# Patient Record
Sex: Male | Born: 1935 | Race: White | Hispanic: No | Marital: Married
Health system: Southern US, Community
[De-identification: ages and names within clinical notes are randomized; demographics above are authoritative.]

---

## 2004-10-14 ENCOUNTER — Emergency Department: Payer: Self-pay | Admitting: Emergency Medicine

## 2004-10-14 ENCOUNTER — Other Ambulatory Visit: Payer: Self-pay

## 2004-10-28 ENCOUNTER — Ambulatory Visit: Payer: Self-pay | Admitting: Family Medicine

## 2005-08-31 ENCOUNTER — Ambulatory Visit: Payer: Self-pay | Admitting: Urology

## 2006-04-06 ENCOUNTER — Ambulatory Visit: Payer: Self-pay | Admitting: Psychiatry

## 2006-05-03 ENCOUNTER — Ambulatory Visit: Payer: Self-pay | Admitting: Psychiatry

## 2006-05-11 ENCOUNTER — Ambulatory Visit: Payer: Self-pay | Admitting: Psychiatry

## 2006-08-26 IMAGING — CT CT ABD-PELV W/O CM
1 of 2 series · 15 of 32 positions shown, 19 images · non-contrast
Comparison: none

REASON FOR EXAM: (1) abd pain  STONE EVAL RM 12; (2) abd pain STONE EVAL RM
12
COMMENTS:

[Series 2: stone · axial · 0.72mm/px · z∈[-792,-354]mm · 15 of 160 slices shown, 19 images]
[im 7/160  soft-tissue]
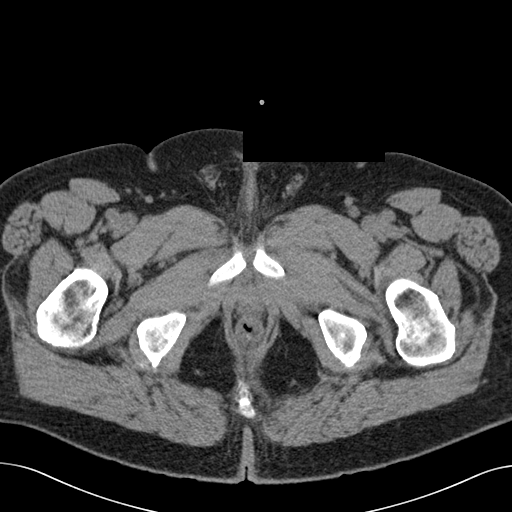
[im 7/160  bone]
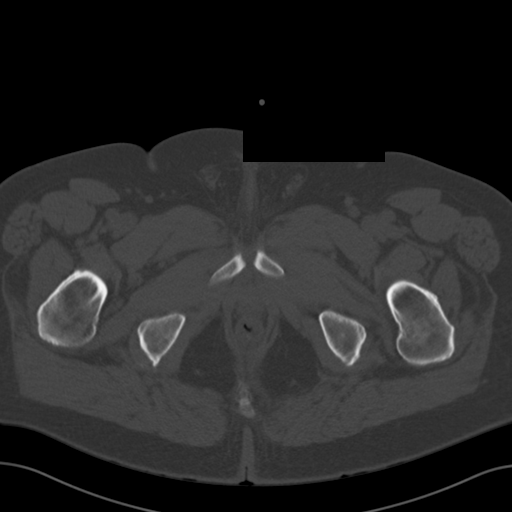
[im 20/160  soft-tissue]
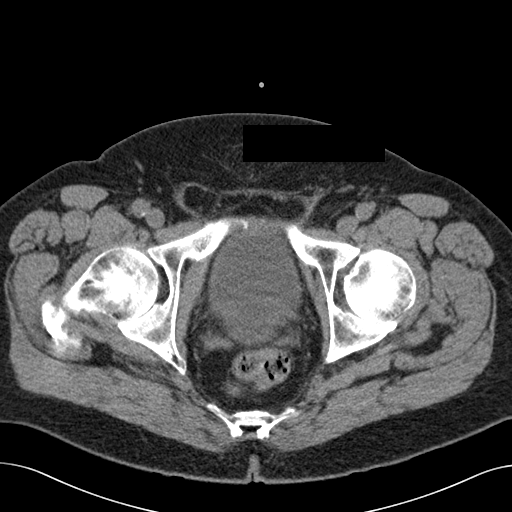
[im 34/160  soft-tissue]
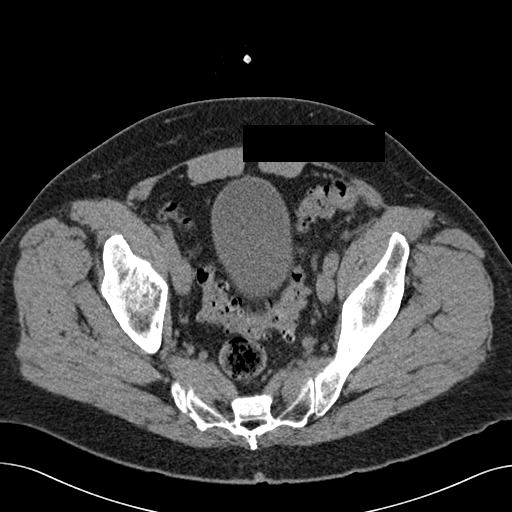
[im 47/160  soft-tissue]
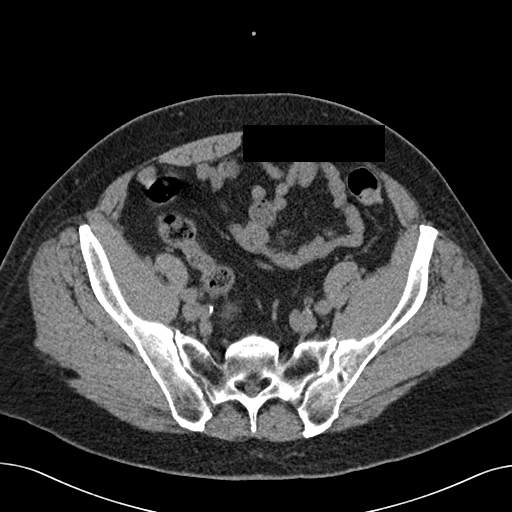
[im 54/160  soft-tissue]
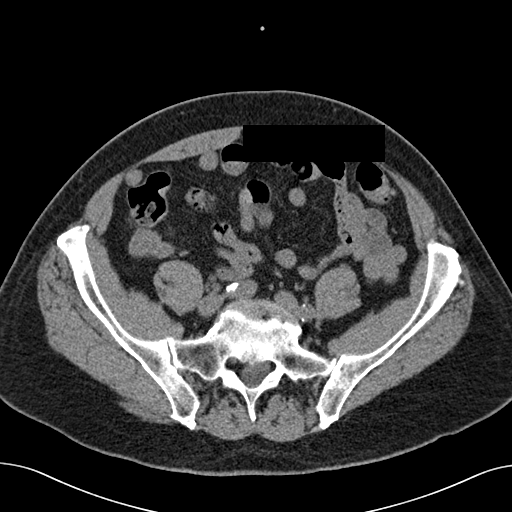
[im 67/160  soft-tissue]
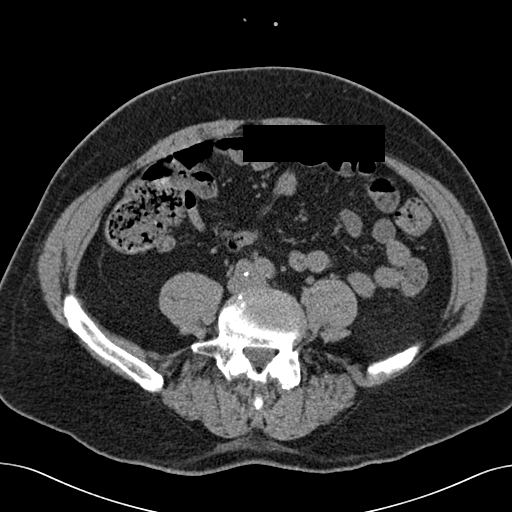
[im 80/160  soft-tissue]
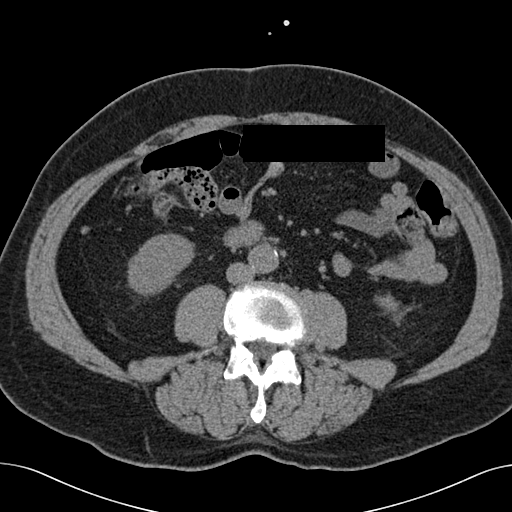
[im 93/160  soft-tissue]
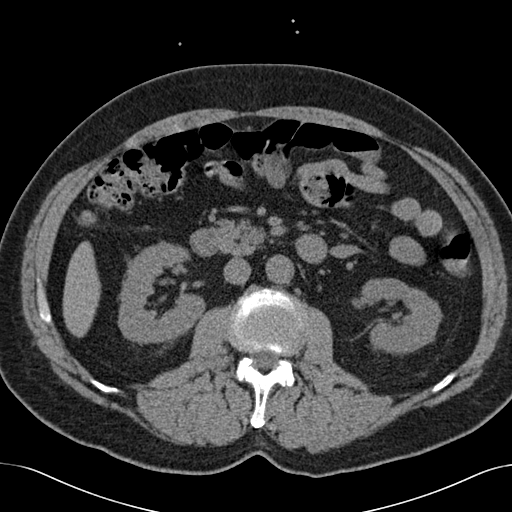
[im 107/160  soft-tissue]
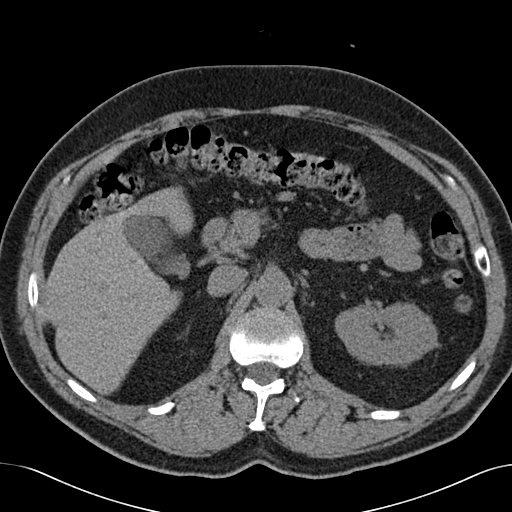
[im 107/160  bone]
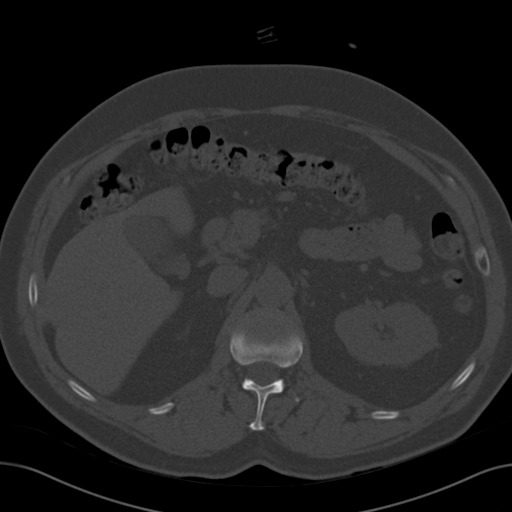
[im 113/160  soft-tissue]
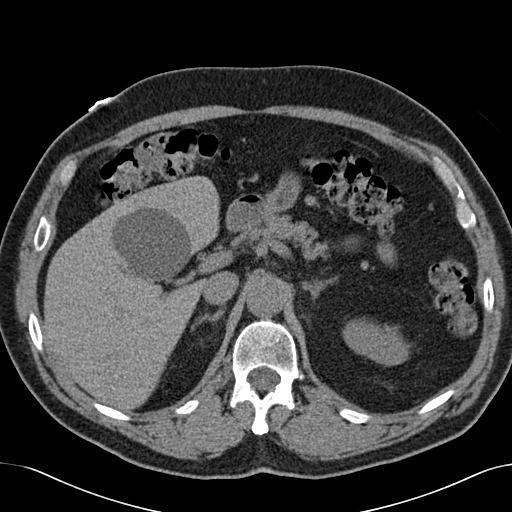
[im 126/160  soft-tissue]
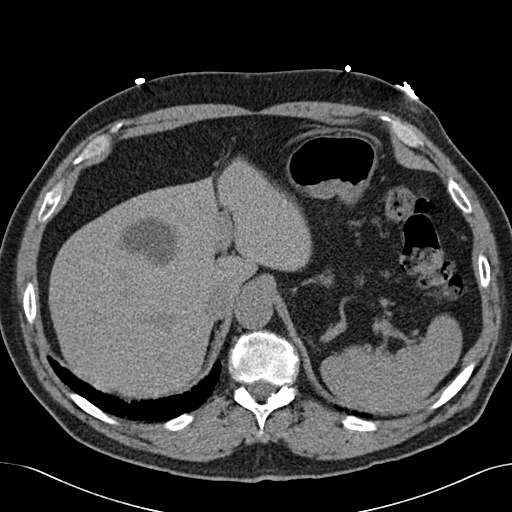
[im 133/160  lung]
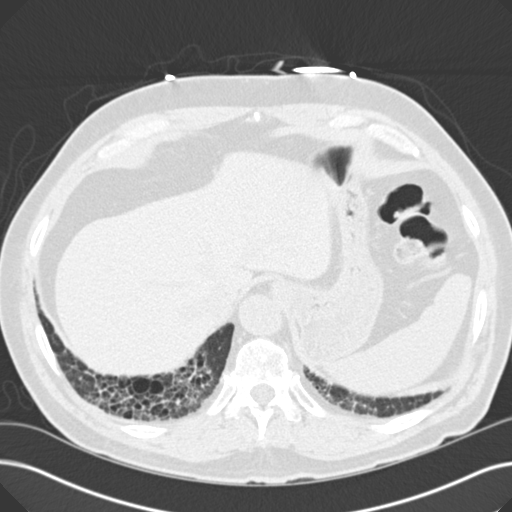
[im 140/160  soft-tissue]
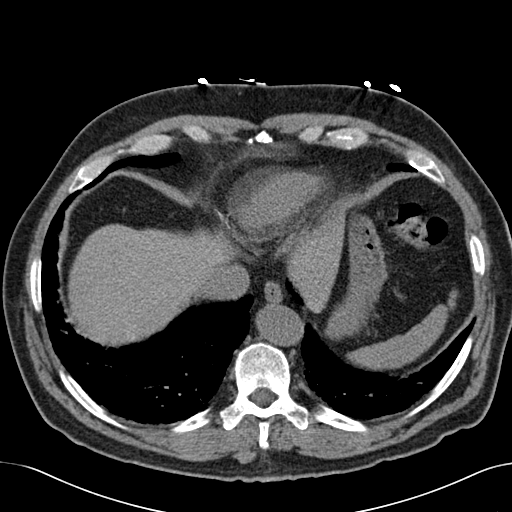
[im 140/160  lung]
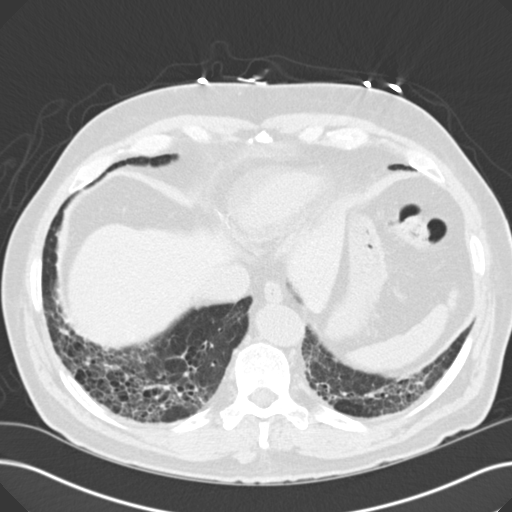
[im 146/160  lung]
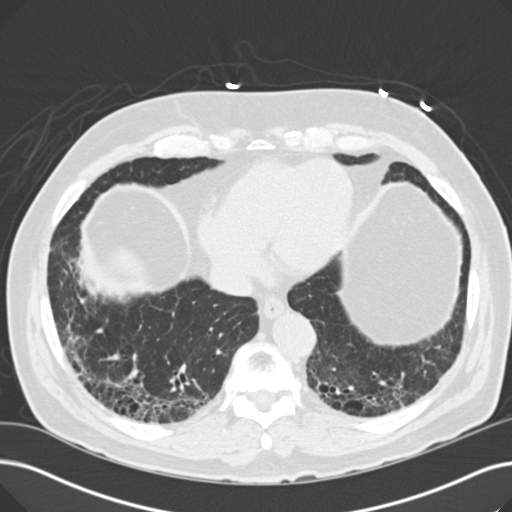
[im 153/160  soft-tissue]
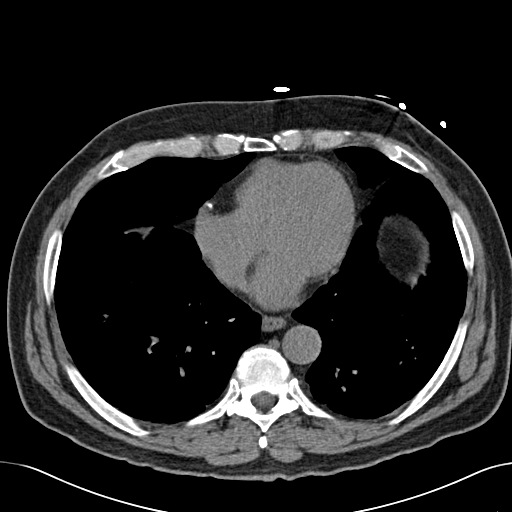
[im 153/160  lung]
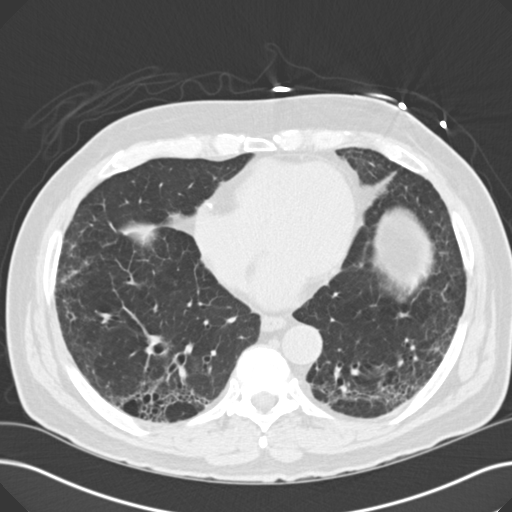

[15 of 32 positions shown; findings below may reference images not displayed]

PROCEDURE:     CT  - CT ABDOMEN AND PELVIS W[DATE]  [DATE]

RESULT:     Unenhanced, emergent abdominal pelvic CT was performed from the
hemidiaphragm to the pubic symphysis.  There is noted a large, low density
in the area of the RIGHT lobe of the liver anteriorly as well as smaller low
density area posteriorly in the RIGHT lobe which can represent cyst.  This
can be further interrogated with ultrasound and also follow up enhanced CT
images.

Pancreas appears intact while the adrenals are intact.

No renal calculi are seen.  No hydronephrosis is noted.  No retroperitoneal
adenopathy is seen.  No fluid is noted in the abdomen and/or pelvis to
suggest an inflammatory process.  Multiple diverticula noted in the sigmoid
colon region.
IMPRESSION: Apparent cyst in the RIGHT lobe of the liver which can be
further investigated with ultrasound contrast CT.  Multiple diverticula
noted in the sigmoid colon region, otherwise, no significant abnormalities
noted in the abdomen and/or pelvis.  On lung window setting there is noted
fibrosis in the lower lobes bilaterally.

## 2007-12-08 ENCOUNTER — Observation Stay: Payer: Self-pay | Admitting: Specialist

## 2007-12-08 ENCOUNTER — Other Ambulatory Visit: Payer: Self-pay

## 2008-03-16 ENCOUNTER — Ambulatory Visit: Payer: Self-pay | Admitting: Physician Assistant

## 2009-05-06 ENCOUNTER — Ambulatory Visit: Payer: Self-pay | Admitting: Neurology

## 2009-05-15 ENCOUNTER — Ambulatory Visit: Payer: Self-pay | Admitting: Neurology

## 2009-11-28 ENCOUNTER — Ambulatory Visit: Payer: Self-pay | Admitting: Specialist

## 2009-12-06 ENCOUNTER — Ambulatory Visit: Payer: Self-pay | Admitting: Specialist

## 2010-01-30 ENCOUNTER — Ambulatory Visit: Payer: Self-pay | Admitting: Internal Medicine

## 2010-02-12 ENCOUNTER — Ambulatory Visit: Payer: Self-pay | Admitting: Specialist

## 2010-02-19 ENCOUNTER — Ambulatory Visit: Payer: Self-pay | Admitting: Internal Medicine

## 2010-04-01 ENCOUNTER — Ambulatory Visit: Payer: Self-pay | Admitting: Specialist

## 2010-04-02 ENCOUNTER — Emergency Department: Payer: Self-pay | Admitting: Emergency Medicine

## 2010-09-16 ENCOUNTER — Ambulatory Visit: Payer: Self-pay | Admitting: Specialist

## 2011-02-11 ENCOUNTER — Ambulatory Visit: Payer: Self-pay | Admitting: Specialist

## 2011-08-13 ENCOUNTER — Ambulatory Visit: Payer: Self-pay | Admitting: Specialist

## 2011-08-21 ENCOUNTER — Ambulatory Visit: Payer: Self-pay | Admitting: Specialist

## 2011-08-21 LAB — CREATININE, SERUM
EGFR (African American): >60
EGFR (Non-African Amer.): >60

## 2011-10-14 ENCOUNTER — Ambulatory Visit: Payer: Self-pay | Admitting: Ophthalmology

## 2011-10-19 ENCOUNTER — Ambulatory Visit: Payer: Self-pay | Admitting: Ophthalmology

## 2011-11-09 ENCOUNTER — Ambulatory Visit: Payer: Self-pay | Admitting: Ophthalmology

## 2012-08-31 ENCOUNTER — Ambulatory Visit: Payer: Self-pay | Admitting: Specialist

## 2012-11-04 ENCOUNTER — Inpatient Hospital Stay: Payer: Self-pay | Admitting: Family Medicine

## 2012-11-04 LAB — COMPREHENSIVE METABOLIC PANEL
Albumin: 3.2 g/dL — ABNORMAL LOW (ref 3.4–5.0)
Alkaline Phosphatase: 40 U/L — ABNORMAL LOW (ref 50–136)
Bilirubin,Total: 0.5 mg/dL (ref 0.2–1.0)
Calcium, Total: 8.3 mg/dL — ABNORMAL LOW (ref 8.5–10.1)
Chloride: 102 mmol/L (ref 98–107)
Co2: 31 mmol/L (ref 21–32)
EGFR (African American): >60
Glucose: 98 mg/dL (ref 65–99)
Potassium: 3.6 mmol/L (ref 3.5–5.1)
SGOT(AST): 22 U/L (ref 15–37)
SGPT (ALT): 20 U/L (ref 12–78)
Total Protein: 6.7 g/dL (ref 6.4–8.2)

## 2012-11-04 LAB — CBC
HGB: 14 g/dL (ref 13.0–18.0)
MCHC: 34.1 g/dL (ref 32.0–36.0)
MCV: 95 fL (ref 80–100)
RDW: 13.4 % (ref 11.5–14.5)

## 2012-11-04 LAB — CK TOTAL AND CKMB (NOT AT ARMC): CK-MB: <0.5 ng/mL — ABNORMAL LOW (ref 0.5–3.6)

## 2012-11-04 LAB — TROPONIN I: Troponin-I: <0.02 ng/mL

## 2012-11-06 LAB — CBC WITH DIFFERENTIAL/PLATELET
HGB: 14 g/dL (ref 13.0–18.0)
Lymphocyte %: 8.1 %
MCH: 32.4 pg (ref 26.0–34.0)
MCHC: 33.9 g/dL (ref 32.0–36.0)
MCV: 95 fL (ref 80–100)
Monocyte #: 0.4 x10 3/mm (ref 0.2–1.0)
Monocyte %: 4.4 %
Neutrophil #: 8.8 10*3/uL — ABNORMAL HIGH (ref 1.4–6.5)
Neutrophil %: 87.5 %
Platelet: 175 10*3/uL (ref 150–440)
RDW: 13.5 % (ref 11.5–14.5)
WBC: 10.1 10*3/uL (ref 3.8–10.6)

## 2012-11-06 LAB — BASIC METABOLIC PANEL
Anion Gap: 3 — ABNORMAL LOW (ref 7–16)
Calcium, Total: 8.6 mg/dL (ref 8.5–10.1)
Chloride: 103 mmol/L (ref 98–107)
Co2: 32 mmol/L (ref 21–32)
Creatinine: 1.02 mg/dL (ref 0.60–1.30)
EGFR (African American): >60
EGFR (Non-African Amer.): >60
Glucose: 127 mg/dL — ABNORMAL HIGH (ref 65–99)

## 2012-11-07 LAB — EXPECTORATED SPUTUM ASSESSMENT W GRAM STAIN, RFLX TO RESP C

## 2012-11-07 LAB — CBC WITH DIFFERENTIAL/PLATELET
Basophil #: 0 10*3/uL (ref 0.0–0.1)
Eosinophil %: 0.3 %
HCT: 40.5 % (ref 40.0–52.0)
HGB: 13.7 g/dL (ref 13.0–18.0)
Lymphocyte #: 1.5 10*3/uL (ref 1.0–3.6)
MCH: 32.3 pg (ref 26.0–34.0)
MCHC: 33.8 g/dL (ref 32.0–36.0)
Neutrophil %: 73.6 %
Platelet: 162 10*3/uL (ref 150–440)
RDW: 13.3 % (ref 11.5–14.5)
WBC: 9.2 10*3/uL (ref 3.8–10.6)

## 2013-06-24 IMAGING — RF DG CHEST FLUORO
2 series · 8 of 8 positions shown · non-contrast
Comparison: none

REASON FOR EXAM: SNIFF TEST elevated right diaphragm
COMMENTS:

PROCEDURE:     FL  - FL CHEST FLUORO W/ SPOT FILMS  - August 13, 2011  [DATE]
RESULT:     Comparison: Chest radiograph from [REDACTED] 08/11/2011
TECHNIQUE: The diaphragms were monitored with intermittent fluoroscopy
during inspiration and expiration.

[Series 2: fluoro_chest_bw · 0.18mm/px · 4 of 20 frames shown (1 of 2)]
[frame 4/20]
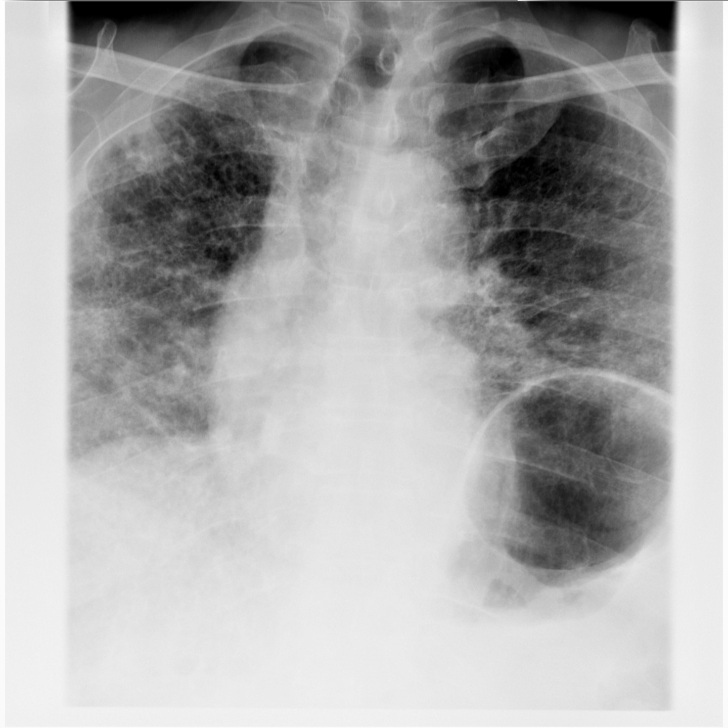
[frame 11/20]
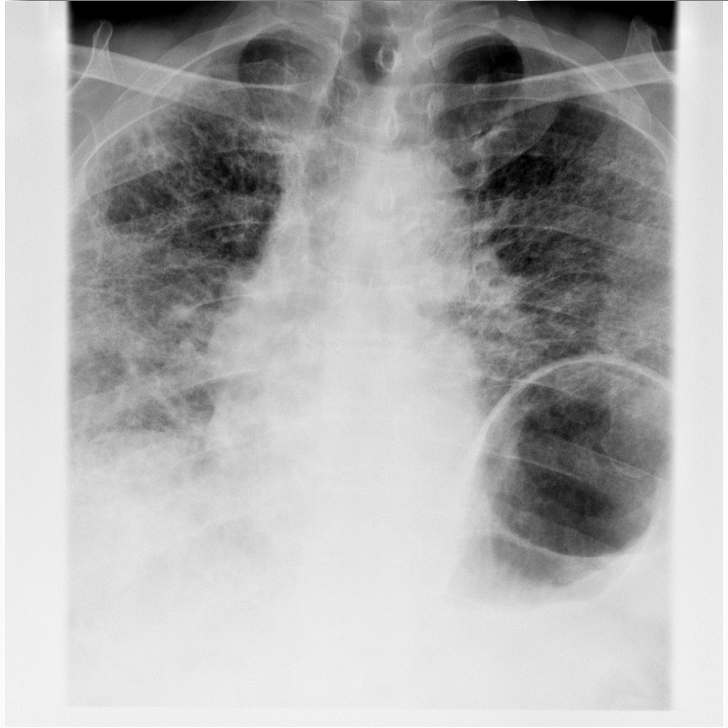
[frame 13/20]
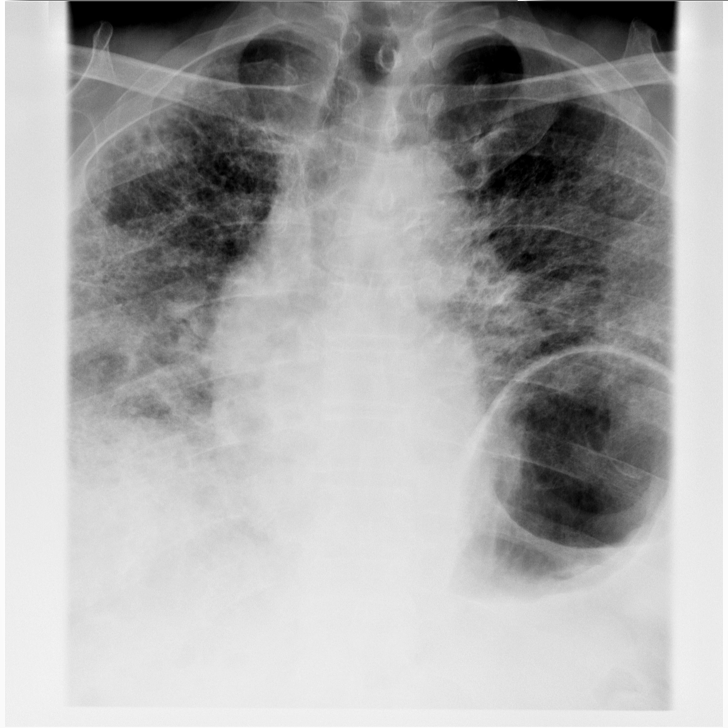
[frame 18/20]
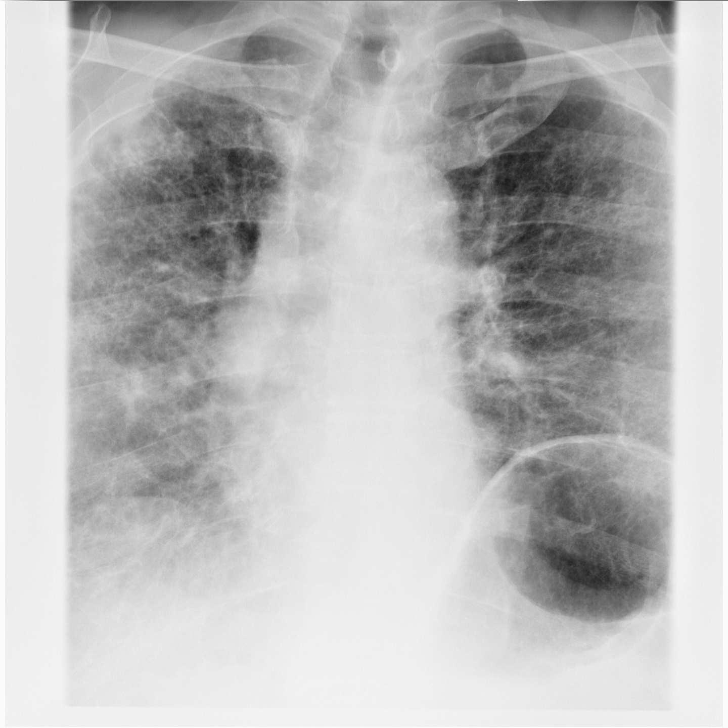

[Series 3: fluoro_chest_bw · 0.18mm/px · 4 of 14 frames shown (2 of 2)]
[frame 1/14]
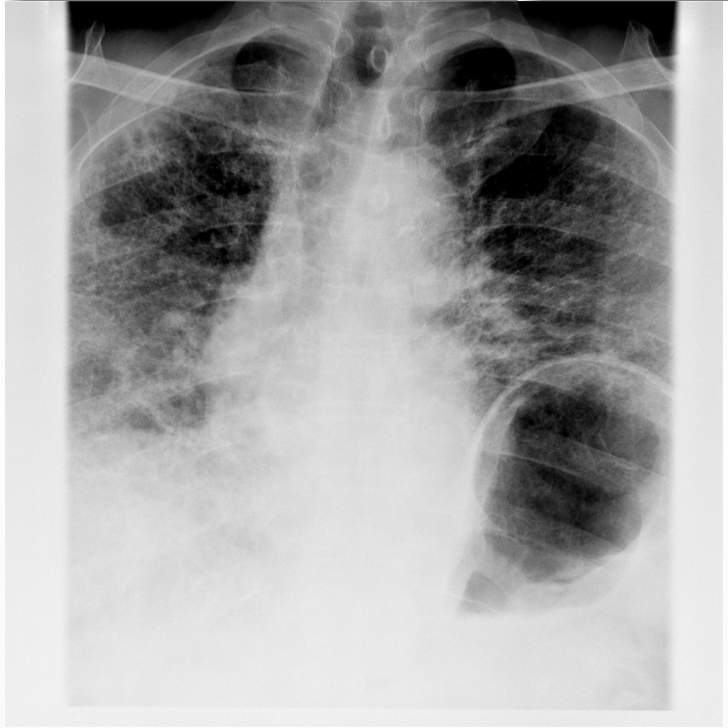
[frame 3/14]
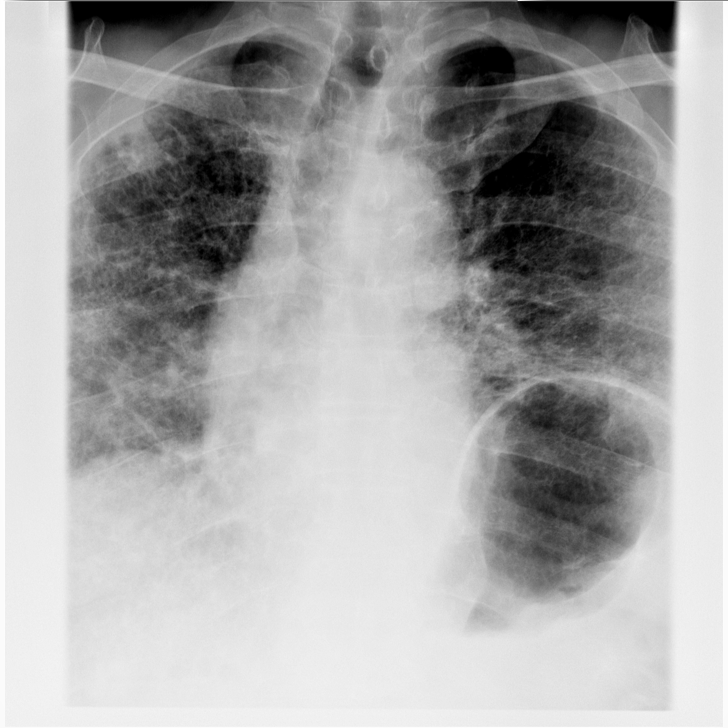
[frame 8/14]
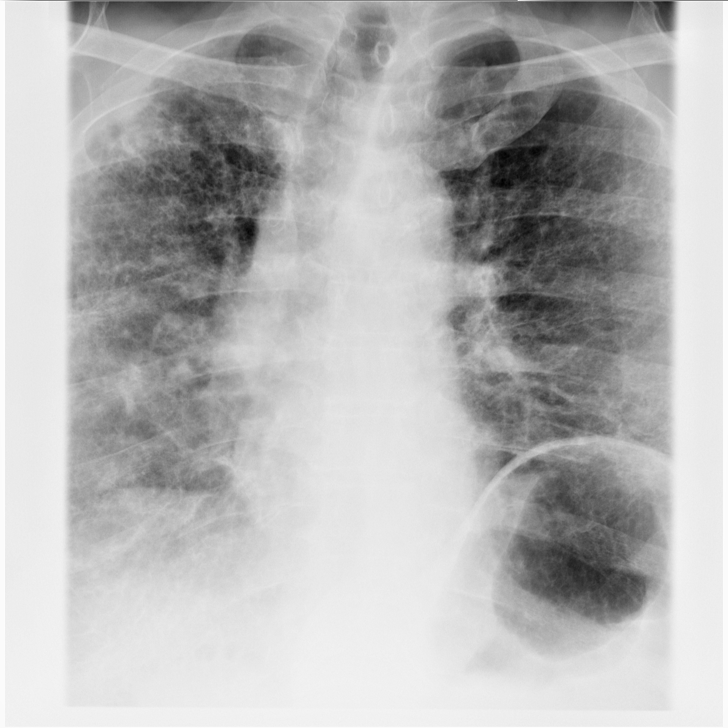
[frame 12/14]
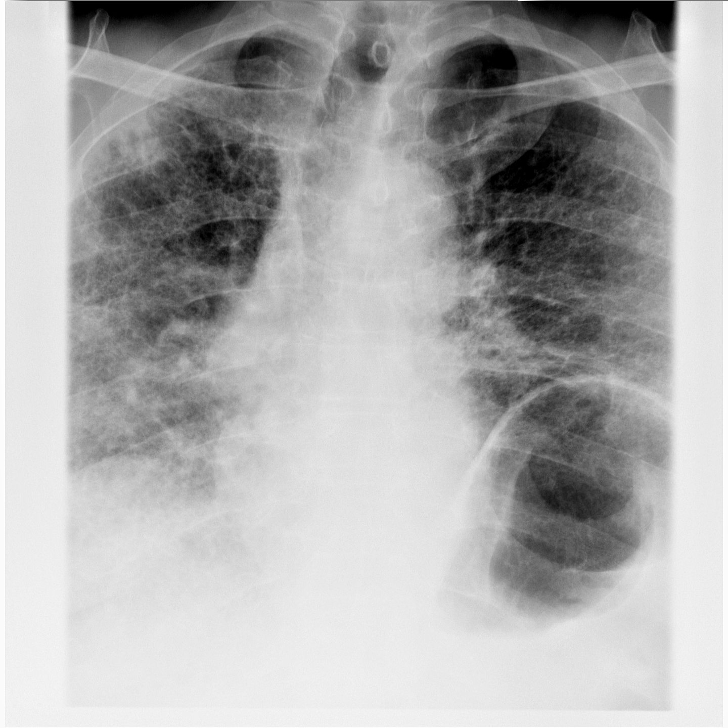

[8 of 8 positions shown; findings below may reference images not displayed]

FINDINGS: There is mild elevation of the left hemidiaphragm. There is excursion of the
diaphragm bilaterally, with inspiration and expiration. With the sniff test,
there is appropriate excursion of the diaphragm bilaterally.

There are coarse interstitial opacities in the lungs, consistent with
fibrosis. There is more focal opacity at the periphery the right upper lobe,
as seen on recent prior chest radiograph.
IMPRESSION: 1. No evidence of diaphragm paralysis.
2. Findings of pulmonary fibrosis, with more focal opacity at the periphery
right upper lobe. Please see recent prior chest radiograph report where
further evaluation with chest CT was recommended.

## 2013-11-12 ENCOUNTER — Emergency Department: Payer: Self-pay | Admitting: Emergency Medicine

## 2013-11-12 LAB — CBC
HCT: 45 % (ref 40.0–52.0)
HGB: 14.8 g/dL (ref 13.0–18.0)
MCH: 32 pg (ref 26.0–34.0)
MCHC: 32.9 g/dL (ref 32.0–36.0)
MCV: 97 fL (ref 80–100)
Platelet: 225 10*3/uL (ref 150–440)
RBC: 4.62 10*6/uL (ref 4.40–5.90)
RDW: 12.5 % (ref 11.5–14.5)
WBC: 11.1 10*3/uL — ABNORMAL HIGH (ref 3.8–10.6)

## 2013-11-12 LAB — URINALYSIS, COMPLETE
Bilirubin,UR: NEGATIVE
Glucose,UR: NEGATIVE mg/dL (ref 0–75)
Ketone: NEGATIVE
Leukocyte Esterase: NEGATIVE
Nitrite: NEGATIVE
Ph: 5 (ref 4.5–8.0)
Protein: 100
RBC,UR: 2667 /HPF (ref 0–5)
Specific Gravity: 1.023 (ref 1.003–1.030)
Squamous Epithelial: NONE SEEN
WBC UR: 6 /HPF (ref 0–5)

## 2013-11-12 LAB — COMPREHENSIVE METABOLIC PANEL
ALBUMIN: 3.6 g/dL (ref 3.4–5.0)
ALK PHOS: 55 U/L
ANION GAP: 6 — AB (ref 7–16)
BILIRUBIN TOTAL: 0.5 mg/dL (ref 0.2–1.0)
BUN: 19 mg/dL — ABNORMAL HIGH (ref 7–18)
CALCIUM: 9 mg/dL (ref 8.5–10.1)
CREATININE: 1.22 mg/dL (ref 0.60–1.30)
Chloride: 103 mmol/L (ref 98–107)
Co2: 27 mmol/L (ref 21–32)
EGFR (African American): >60
GFR CALC NON AF AMER: 54 — AB
GLUCOSE: 133 mg/dL — AB (ref 65–99)
OSMOLALITY: 276 (ref 275–301)
Potassium: 4.3 mmol/L (ref 3.5–5.1)
SGOT(AST): 23 U/L (ref 15–37)
SGPT (ALT): 22 U/L (ref 12–78)
SODIUM: 136 mmol/L (ref 136–145)
TOTAL PROTEIN: 7.5 g/dL (ref 6.4–8.2)

## 2013-11-12 LAB — LIPASE, BLOOD: LIPASE: 162 U/L (ref 73–393)

## 2013-12-08 ENCOUNTER — Inpatient Hospital Stay: Payer: Self-pay | Admitting: Internal Medicine

## 2013-12-08 ENCOUNTER — Ambulatory Visit: Payer: Self-pay | Admitting: Urology

## 2013-12-08 LAB — CBC
HCT: 43.7 %
HGB: 14 g/dL
MCH: 31.2 pg
MCHC: 32.1 g/dL
MCV: 97 fL
Platelet: 215 x10 3/mm 3
RBC: 4.5 x10 6/mm 3
RDW: 12.7 %
WBC: 11.2 x10 3/mm 3 — ABNORMAL HIGH

## 2013-12-08 LAB — COMPREHENSIVE METABOLIC PANEL WITH GFR
Albumin: 3.6 g/dL
Alkaline Phosphatase: 47 U/L
Anion Gap: 8
BUN: 17 mg/dL
Bilirubin,Total: 0.6 mg/dL
Calcium, Total: 8.7 mg/dL
Chloride: 102 mmol/L
Co2: 28 mmol/L
Creatinine: 1.25 mg/dL
EGFR (African American): >60
EGFR (Non-African Amer.): 53 — ABNORMAL LOW
Glucose: 134 mg/dL — ABNORMAL HIGH
Osmolality: 279
Potassium: 4.1 mmol/L
SGOT(AST): 19 U/L
SGPT (ALT): 20 U/L
Sodium: 138 mmol/L
Total Protein: 7.3 g/dL

## 2013-12-08 LAB — URINALYSIS, COMPLETE
Bilirubin,UR: NEGATIVE
Glucose,UR: NEGATIVE mg/dL (ref 0–75)
Ketone: NEGATIVE
Leukocyte Esterase: NEGATIVE
Nitrite: NEGATIVE
Ph: 7 (ref 4.5–8.0)
Protein: 100
RBC,UR: 1557 /HPF (ref 0–5)
Specific Gravity: 1.016 (ref 1.003–1.030)
Squamous Epithelial: NONE SEEN

## 2013-12-10 LAB — URINE CULTURE

## 2013-12-27 ENCOUNTER — Ambulatory Visit: Payer: Self-pay | Admitting: Urology

## 2013-12-28 ENCOUNTER — Ambulatory Visit: Payer: Self-pay | Admitting: Urology

## 2014-01-01 ENCOUNTER — Ambulatory Visit: Payer: Self-pay | Admitting: Urology

## 2014-01-16 ENCOUNTER — Ambulatory Visit: Payer: Self-pay | Admitting: Urology

## 2014-01-16 LAB — BASIC METABOLIC PANEL
Anion Gap: 6 — ABNORMAL LOW (ref 7–16)
BUN: 10 mg/dL (ref 7–18)
CALCIUM: 8.5 mg/dL (ref 8.5–10.1)
CO2: 32 mmol/L (ref 21–32)
Chloride: 103 mmol/L (ref 98–107)
Creatinine: 0.95 mg/dL (ref 0.60–1.30)
EGFR (African American): >60
EGFR (Non-African Amer.): >60
Glucose: 101 mg/dL — ABNORMAL HIGH (ref 65–99)
OSMOLALITY: 280 (ref 275–301)
POTASSIUM: 4.4 mmol/L (ref 3.5–5.1)
SODIUM: 141 mmol/L (ref 136–145)

## 2014-01-22 ENCOUNTER — Ambulatory Visit: Payer: Self-pay | Admitting: Urology

## 2014-07-24 ENCOUNTER — Emergency Department: Payer: Self-pay | Admitting: Emergency Medicine

## 2014-09-21 NOTE — Discharge Summary (Signed)
PATIENT NAME:  Kevin BookbinderFAUCETTE, Juvencio L MR#:  [id number] DATE OF BIRTH:  June 27, 1926  DATE OF ADMISSION:  11/04/2012 DATE OF DISCHARGE:    DISCHARGE DIAGNOSES:  1.  Acute on chronic respiratory failure secondary to chronic obstructive pulmonary disease exacerbation and underlying pulmonary fibrosis.  2.  History of seizure disorder.   DISCHARGE MEDICATIONS:  1.  Keppra 750 mg 1 tab p.o. b.i.d.  2.  Prednisone 40 mg taper and will continue to 20 mg indefinitely will follow up with Dr. Meredeth IdeFleming for further instruction.  3.  Levaquin 500 mg p.o. daily x 4 more days.  4.  Azathioprine 50 mg p.o. daily.  5.  Pro-Air 90 mcg 2 puffs every 6 hours as needed for wheezing.   CONSULTS: None.   PROCEDURES: None.   PERTINENT LABS AND STUDIES: Noncontributory.   BRIEF HOSPITAL COURSE: The patient initially came in complaining of acute on chronic respiratory failure with underlying COPD and pulmonary fibrosis. His O2 sats were in the 70s upon admission. He was placed on IV Solu-Medrol and Levaquin. He was transitioned over to oral prednisone and his O2 sats did improve above 92% on 4 L, which is baseline for him. He denied any further cough and no further chest discomfort. Shortness of breath was at baseline upon discharge. He will continue his home regimen and continue the prednisone taper. Follow up with Dr. Meredeth IdeFleming in 1 week. Other medical issues of seizure disorder remained stable. Continue on the Keppra.   DISPOSITION: He is in stable condition and will be discharged to home.   FOLLOWUP: Follow up with Dr. Meredeth IdeFleming in 1 week.  ____________________________ Marisue IvanKanhka Jesselle Laflamme, MD kl:aw D: 11/07/2012 08:01:28 ET T: 11/07/2012 08:37:55 ET JOB#: [id number]  cc: Marisue IvanKanhka Dilcia Rybarczyk, MD, <Dictator> Marisue IvanKANHKA Shenell Rogalski MD ELECTRONICALLY SIGNED 11/17/2012 7:58

## 2014-09-21 NOTE — H&P (Signed)
PATIENT NAME:  Kevin Rasmussen, Ford L MR#:  [id number] DATE OF BIRTH:  Mar 10, 1927  DATE OF ADMISSION:  11/04/2012  PRIMARY CARE PHYSICIAN: Daniel NonesBert Klein, MD  PULMONOLOGIST:  Ned ClinesHerbon Fleming, MD  CHIEF COMPLAINT: My oxygen sats dropped down into the 70s, I could not breathe.  HISTORY OF PRESENT ILLNESS:  Mr. Kevin Rasmussen is a pleasant 81 year old Caucasian gentleman with history of severe pulmonary fibrosis, on home oxygen, about 3 liters per minute, and history of osteoarthritis along with seizure disorder who comes to the Emergency Room accompanied by his wife and daughter with increasing shortness of breath, difficulty with maintaining sats despite taking his steroids and breathing treatments at home. His sats dropped down into the 70s. He felt "I was going to die."   Came to the Emergency Room. Received a high dose of IV steroids, breathing treatment, now feels much better and was having lunch during my evaluation.  PAST MEDICAL HISTORY:  1.  Osteoarthritis with history of carpal tunnel and cervical and lumbar disk disease.  2.  Chronic respiratory failure secondary to severe bilateral pulmonary fibrosis, now on prednisone and recently started on azathioprine by Dr. Meredeth IdeFleming.  3.  Chronic home oxygen.  4.  Acid reflux.  5.  Ex-smoker.  6.  Obstructive sleep apnea.  7.  Seizure disorder, followed by Dr. Sherryll BurgerShah.  PAST SURGICAL HISTORY:  1.  Left inguinal hernia repair.  2.  Left elbow fracture with no dislocation.  3.  Left carpal tunnel release.  ALLERGIES: ERYTHROMYCIN AND MYSOLINE.  MEDICATIONS: 1.  Azathioprine 50 mg daily.  2.  Mucinex DM 1 tablet b.i.d.  3.  Prednisone 20 mg daily.  4.  Keppra 750 mg b.i.d.    FAMILY HISTORY: Noted with multiple family members with pulmonary fibrosis.  SOCIAL HISTORY: Married. Lives with wife at home. Uses oxygen at home.   REVIEW OF SYSTEMS:    CONSTITUTIONAL: Positive for fatigue, weakness.  EYES: No blurred or double vision or glaucoma.  ENT: No  tinnitus, ear pain, hearing loss.  RESPIRATORY: Positive for shortness of breath, wheezing, and COPD.  CARDIOVASCULAR: No chest pain, orthopnea, edema. Positive for dyspnea on exertion.  GASTROINTESTINAL: No nausea, vomiting, diarrhea or abdominal pain.  GENITOURINARY: No dysuria or hematuria.  ENDOCRINE: No polyuria, nocturia or thyroid problems.  HEMATOLOGY: No anemia or easy bruising.  SKIN: No acne or rash. GENITOURINARY: No dysuria or hematuria.  ENDOCRINE: No polyuria, nocturia or thyroid problems.  MUSCULOSKELETAL: Positive for arthritis and back pain.  NEUROLOGIC: No CVA, TIA, vertigo or tremor.  PSYCHIATRIC: No anxiety or depression. All other systems reviewed and negative.   PHYSICAL EXAMINATION: GENERAL: The patient is awake, alert and oriented x 3, not in acute distress.  VITAL SIGNS: Afebrile, pulse is 82, blood pressure is 140/49, sats are 91 to 94% on 3.5 liters nasal cannula.  HEENT: Atraumatic, normocephalic. Pupils are equal, round and reactive to light and accommodation. EOM intact. Oral mucosa is moist.  NECK: Supple. No JVD. No carotid bruit. LUNGS:  There are bilateral coarse crackles present in both upper and lower lobes. Distant breath sounds. No use of accessory muscles.  HEART:  Both the heart sounds are normal. Rate and rhythm regular. PMI not lateralized. Chest is nontender.  EXTREMITIES: Good pedal pulses. Good femoral pulses. Trace lower extremity edema.  ABDOMEN: Soft, benign and nontender. No organomegaly. Positive bowel sounds.  NEUROLOGIC: Grossly intact cranial nerves II through XII. No motor or sensory deficit.  PSYCHIATRIC: The patient is awake, alert and oriented x  3.   LABORATORY AND DIAGNOSTICS: CBC within normal limits.   Chest x-ray consistent with increased interstitial markings diffusely, acute interstitial edema of cardiac cause superimposed upon chronic bullous emphysema. EKG shows sinus rhythm.   CBC within normal limits. Comprehensive  metabolic panel within normal limits. Troponin is less than 0.02.   ASSESSMENT: 81 year old Kevin Rasmussen with history of bilateral pulmonary fibrosis, chronic respiratory failure and osteoarthritis who comes in with increasing shortness of breath since today.  Recently his wife started getting "cold" symptoms  and the patient started coughing up some yellow phlegm, came in with sats in the 70s. He received IV Solu-Medrol in the Emergency Room. He is being admitted for:  1.  Acute on chronic hypoxic respiratory failure secondary to chronic obstructive pulmonary disease exacerbation in the setting of severe bilateral pulmonary fibrosis. The patient will be admitted on the medical floor. Will give regular diet. IV Solu-Medrol around-the-clock along with p.o. Levaquin. We will consider continuing albuterol/ipratropium inhalers around-the-clock. Check sputum culture. If the patient's symptoms do not improve, consider pulmonary consultation. 2.  Severe bilateral pulmonary fibrosis.  Follows with Dr. Meredeth Ide as outpatient.  Recently the patient was started on azathioprine, which we will continue. The patient also takes 20 mg prednisone, which will be started once the prednisone taper has been started. Will continue nebulizer treatments around-the-clock along with oxygen.  3.  Seizure disorder. Resume Keppra 750 b.i.d.  4.  Deep venous thrombosis prophylaxis with sub-Q heparin.   Further work-up according to the patient's clinical course. Hospital admission plan was discussed with the patient and the patient's family members.   TIME SPENT: 50 minutes. ____________________________ Wylie Hail Allena Katz, MD sap:sb D: 11/04/2012 14:13:50 ET T: 11/04/2012 15:34:11 ET JOB#: [id number]  cc: Johnpatrick Jenny A. Allena Katz, MD, <Dictator> Curtis Sites III, MD Herbon E. Meredeth Ide, MD  Willow Ora MD ELECTRONICALLY SIGNED 11/12/2012 14:34

## 2014-09-22 NOTE — Op Note (Signed)
PATIENT NAME:  Kevin Rasmussen, Kevin Rasmussen MR#:  [id number] DATE OF BIRTH:  04-13-1927  DATE OF PROCEDURE:  12/09/2013  PREOPERATIVE DIAGNOSES:  1.  Left hydronephrosis.  2.  Left ureterolithiasis.   POSTOPERATIVE DIAGNOSES:  1.  Left hydronephrosis.  2.  Left ureterolithiasis.   PROCEDURE PERFORMED: 1.  Left 6 French x 26 cm Contour ureteral stent placement. 2.  Left diagnostic ureteroscopy with attempted basket extraction of stone.  3.  Left retrograde pyelogram. 4.  Interpretation of urography.  5.  Intraoperative fluoroscopy, total time less than 1 hour.   ANESTHESIA:  General anesthesia with LMA.   COMPLICATIONS: None.   SPECIMENS: None.   DRAINS: Ureteral stent as above.   FINDINGS: There was about a 6 mm stone present in the distal ureter, but it would not pull through with the basket. I did not have access to a laser and, thus, I did not feel it was prudent to try to pull the stone down through this area and risk damage to the ureter.   INDICATIONS FOR SURGERY: Mr. Kevin Rasmussen is a pleasant 81 year old male with a 4-week history of left renal colic. He presented to the Emergency Room for the second time yesterday. After discussing risks and benefits of the operation, including potential ureteroscopic stone extraction, the patient elected to proceed to the OR for, at the minimum, stent placement and possible stent removal.   OPERATIVE PROCEDURE:  After the patient correctly identified in the preoperative room, he was brought to the operating room and placed supine on the operating table.  A pre-procedure timeout was called. Preoperative antibiotics were administered and general anesthesia with LMA was then induced. The patient then placed in the dorsal lithotomy position taking care to pad all pressure points. He was then sterilely prepped and draped in the usual fashion followed by a pre-incision timeout. Ample lubrication with saline running, a 21 French rigid cystoscope was advanced per  urethra in the bladder. There were no urethral or prostatic abnormalities. There were no stones present in the bladder. No bladder masses.  The left ureteral orifice was cannulated with a guidewire and then an open-ended catheter was passed over this.  A retrograde pyelogram was shot and the stone was noted in the distal ureter. There was mild to moderate left hydroureter and hydronephrosis. This wire was then advanced up into the kidney through the  open-ended catheter. A second wire was then placed in the kidney. The flexible ureteroscope was then advanced over the access wire leaving the safety wire in place. I could not get the flexible ureteroscope through the ureteral orifice, even after removing the axis guidewire. I then switched for a rigid ureteroscope, and I was able to get this through the ureteral orifice up to the stone in the distal ureter.  It was about 6 mm in size. I then passed a 1.9, French skate basket into the ureter and was able to grasp it in the ideal orientation.  I then carefully attempted to pull it down the distal ureter, and while I was able to move it a few centimeters, it would not go through the distal 5 cm or so of ureter. Tried various orientations to do so, but I could not pull it through this narrow area, and given that I did not have access to a laser, I did not think it was prudent to attempt aggressive removal of this stone. I, therefore, left the stone in place and removed the basket and ureteroscope. I shot another retrograde  pyelogram and noted no extravasation of contrast and continued hydronephrosis. Then passed a 6 French x 26 cm Contour ureteral stent over a wire up into the kidney. Good curl was noted in the renal pelvis on fluoroscopy and under direct visualization in the bladder. Images documenting this were taken and saved. Bladder was then drained and the patient was awoken from anesthesia without complication.  Transferred to the recovery room for postoperative  care.    ____________________________ Lisabeth Pick, MD jps:ts D: 12/09/2013 10:19:58 ET T: 12/09/2013 12:04:14 ET JOB#: [id number]  cc: Lisabeth Pick, MD, <Dictator> Aloha Gell [facility] MD ELECTRONICALLY SIGNED 01/10/2014 13:08

## 2014-09-22 NOTE — Discharge Summary (Signed)
PATIENT NAME:  Kevin Rasmussen, Kevin Rasmussen MR#:  [id number] DATE OF BIRTH:  Jan 04, 1927  DATE OF ADMISSION:  12/08/2013 DATE OF DISCHARGE:  12/09/2013  DISCHARGE DIAGNOSES:  1. Ureterolithiasis. 2. Hydronephrosis.  3. Chronic obstructive pulmonary disease on home oxygen.   DISCHARGE MEDICATIONS: Norco p.r.n. pain, azathioprine 50 mg daily, albuterol p.r.n., usual home oxygen.   HISTORY AND PHYSICAL: Please see detailed history and physical done on admission.   HOSPITAL COURSE: The patient was admitted with renal colic, was seen the following day by urology. Removal of the right obstructing stone with hydronephrosis was attempted. This could not be done but a stent was placed which, relieved the hydronephrosis. It was recommended by urology to discharge the patient home on pain control. They also recommended Flomax but patient said he could not tolerate that. He had severe diarrhea episode from that before and did not wish to take that. He had been started on antibiotics so his urine was normal except for hematuria and urine culture was negative to date and he is not felt to need that by urology consult. Therefore, he will not be discharged on that. He already has followup and he will continue to keep his followup with urology as an outpatient. He will call his primary if he has any trouble getting to see them in the next few days.    ____________________________ Marya AmslerMarshall W. Dareen PianoAnderson, MD mwa:lt D: 12/09/2013 12:48:38 ET T: 12/09/2013 21:22:00 ET JOB#: [id number]  cc: Marya AmslerMarshall W. Dareen PianoAnderson, MD, <Dictator> Lauro RegulusMARSHALL W Husein Guedes MD ELECTRONICALLY SIGNED 12/10/2013 11:06

## 2014-09-22 NOTE — H&P (Signed)
PATIENT NAME:  Kevin Rasmussen, Kevin Rasmussen MR#:  [id number] DATE OF BIRTH:  Jul 27, 1926  DATE OF ADMISSION:  12/08/2013  PRIMARY CARE PHYSICIAN:  Dr. Daniel Nones.   REFERRING PHYSICIAN:  Dr. Carollee Massed.    CHIEF COMPLAINT:  Left-sided back and testicular pain.   HISTORY OF PRESENT ILLNESS:  Kevin Rasmussen is an 81 year old male who has been experiencing left flank pain for about three weeks back at which time the patient came to the Emergency Department.  CT abdomen and pelvis revealed the patient had left ureteral stone.  The patient was told that the patient will pass the stone.  If the patient continues to have the pain, recommended the patient to go to the urology, however the patient's symptoms were resolved after two days; however, the patient has been having on and off bloody urine.  Since this morning, started to experience left flank pain radiating into the left testicle.  Concerning this, came to the Emergency Department.  Work-up in the Emergency Department shows increased hydronephrosis and left hydroureter since the previous exam, determining 6 mm distal left ureteral calculus.  The stone has progressed distally from the proximal ureter.  Mildly increased left perinephric edema.  Concerning this, urology was consulted who recommended the patient to be admitted under the medicine service and who will see the patient in the morning to place a stent.  Denies having any fever.  UA, no obvious signs of any infection is noted.  Was nauseated.  No episodes of vomiting.  PAST MEDICAL HISTORY: 1.  COPD.  2.  Pulmonary fibrosis.  3.  History of seizures.  4.  Enlarged prostate.  5.  History of lung mass.   PAST SURGICAL HISTORY: 1.  Left arm surgery.  2.  Hernia repair.   ALLERGIES:   1.  .  2.  ERYTHROMYCIN.   HOME MEDICATIONS: 1.  ProAir 2 puffs 4 times a day.  2.  Keppra 750 mg 2 times a day.  3.  Azathioprine 50 mg once a day.   SOCIAL HISTORY:  Quit about 25 years back.  Occasional alcohol use.   Denies using any illicit drugs.  Married, lives with his wife.   FAMILY HISTORY:  Father died of COPD.   REVIEW OF SYSTEMS: CONSTITUTIONAL:  Has been experiencing generalized weakness. EYES:  No change in vision.  EARS, NOSE, THROAT:  No change in hearing.  RESPIRATORY:  Has shortness of breath with mild exertion.   CARDIOVASCULAR:  No chest pain, palpations.  GASTROINTESTINAL:  Has nausea.  No vomiting.  GENITOURINARY:  Has hematuria.  SKIN:  No rash or lesions.  MUSCULOSKELETAL:  No joint pains and aches.  NEUROLOGIC:  No weakness or numbness in any part of the body.   PHYSICAL EXAMINATION: GENERAL:  This is a well-built, well-nourished, age-appropriate male lying down in the bed, not in distress.  VITAL SIGNS:  Temperature 98.7, pulse 88, blood pressure 143/68, respiratory rate of 18, oxygen saturation is 94% on room air.  HEENT:  Head normocephalic, atraumatic.  Eyes, no scleral icterus.  Conjunctivae normal.  Pupils equal and react to light.  Extraocular movements are intact.  Mucous membranes moist.  No pharyngeal erythema.  NECK:  Supple.  No lymphadenopathy.  No JVD.  No carotid bruit.  No thyromegaly.  CHEST:  Has no focal tenderness.  Bilateral diffuse fine crackles are heard.  HEART:  S1, S2 regular.  No murmurs are heard.  ABDOMEN:  Bowel sounds plus.  Soft.  Has mild tenderness on  the left side of the abdomen.  No rebound or guarding.  Could not appreciate any hepatosplenomegaly.  EXTREMITIES:  No pedal edema.  Pulses 2+.  SKIN:  No rash or lesions.  MUSCULOSKELETAL:  Good range of motion in all the extremities.  NEUROLOGIC:  The patient is alert, oriented to place, person, and time.  Cranial nerves II through XII intact.  Motor 5 by 5 in upper and lower extremities.   LABORATORY DATA:  CT abdomen and pelvis, increased hydronephrosis and left hydroureter, mildly increased left perinephric edema.  Nonobstructing right renal calculus.   UA negative for nitrites and  leukocyte esterase.  CMP is completely within normal limits.  CBC:  WBC of 11.2.  The rest of all the values are within normal limits.   ASSESSMENT AND PLAN:  Kevin Rasmussen is an 81 year old male who comes with left ureterKerney Elbeal calculus causing the left hydronephrosis.  1.  Left ureteral calculus.  The patient will be seen by urology tomorrow, will be undergoing ureteral stent placement.  Considering the patient's elevated white blood cell count and perinephric stranding, keep the patient on Rocephin.  2.  History of seizure disorder.  Continue with home medications.  3.  History of pulmonary fibrosis on chronic oxygen.  Continue with the oxygen.  The patient is at his baseline requirement of oxygen.   4.  Keep the patient on deep vein thrombosis prophylaxis with sequential compression devices considering the patient's hematuria.    ____________________________ Susa GriffinsPadmaja Frankie Zito, MD pv:ea D: 12/08/2013 23:56:31 ET T: 12/09/2013 00:33:47 ET JOB#: [id number]  cc: Susa GriffinsPadmaja Hermione Havlicek, MD, <Dictator> Lynnea FerrierBert J. Klein III, MD Clerance LavPADMAJA Zonnique Norkus MD ELECTRONICALLY SIGNED 12/13/2013 0:01

## 2014-09-22 NOTE — Op Note (Signed)
PATIENT NAME:  Rolm BookbinderFAUCETTE, Adi L MR#:  [id number] DATE OF BIRTH:  1927/05/08  DATE OF PROCEDURE:  01/22/2014  PREOPERATIVE DIAGNOSIS: Distal left ureteral calculus.   POSTOPERATIVE DIAGNOSIS: Distal left ureteral calculus.   PROCEDURE: Left ureteroscopy, laser lithotripsy with calculus removal and stent removal   ANESTHESIA: General.   SPECIMEN: Yes.  COMPLICATIONS: None.   BLOOD LOSS: Zero.   DESCRIPTION OF PROCEDURE: With the patient sterilely prepped and draped in supine lithotomy position for ease of approach to the external genitalia and after an appropriate timeout, we begin the procedure. He has good relaxation from the general anesthetic and I am able to instrument his urethra. There are no strictures. The prostate is very small. There is quite a bit of edema in the distal ureter. A slightly calcified stent is seen and easily removed with flexible grasping forceps. Then through the edematous ureteral orifice I place a 0.035 Glidewire. It easily traverses the ureter. Then I take a short rigid ureteroscope, search the ureter all the way to the renal pelvis. Initially, no stone is seen and then once I get withdrawal of the ureteroscope I see a distal left stone somewhat through the wall of the ureter. I free it up with the ureteroscope by gently pushing on it and once it is in a more capacious portion of the dilated ureter I am able to use a 365 micron holmium laser, forward firing, and disintegrate the calculus quickly. Then I take a 4 wire nitinol wire basket with a 00 tip and grasp the largest fragment. The other fragments that I have created are too small to grasp and I remove the largest fragment. The wire is removed with it and the patient has his bladder emptied, and 30 mL of 0.5% Marcaine placed in the bladder, plain Marcaine without any epinephrine. A B and O suppository is placed in the rectum. The rectal exam reveals no tumor masses or growths, and the small prostate. The view of the  bladder reveals no tumor masses or growths. There are no strictures in the urethra and a small prostate. The patient is sent to recovery in satisfactory condition.     ____________________________ Caralyn Guileichard D. Edwyna ShellHart, DO rdh:lt D: 01/22/2014 08:09:58 ET T: 01/22/2014 15:05:11 ET JOB#: [id number]  cc: Caralyn Guileichard D. Edwyna ShellHart, DO, <Dictator> RICHARD D HART DO ELECTRONICALLY SIGNED 01/26/2014 14:15

## 2014-09-22 NOTE — Consult Note (Signed)
Admit Diagnosis:   NEPHROLITHIASIS: Onset Date: 08-Dec-2013, Status: Active, Description: NEPHROLITHIASIS    Seizures:    COPD:    chronic sinitis:    lung mass:    enlarged prostate:    hernia repair:     Urology Consult, URGENT - requires response within 12 hours  Reason for-hematuria, stone - 7 mm - left ureter.  Ordering Physican:Kaminski, Leonie Green, 08-Dec-2013, Pending, Standard  Home Medications: Medication Instructions Status  ProAir HFA CFC free 90 mcg/inh inhalation aerosol 2 puff(s) inhaled 4 times a day Active  azaTHIOprine 50 mg oral tablet 1 tab(s) orally once a day Active  Keppra 750 mg oral tablet 1 tab(s) orally 2 times a day Active   Lab Results: Hepatic:  10-Jul-15 14:49   Bilirubin, Total 0.6  Alkaline Phosphatase 47 (45-117 NOTE: New Reference Range 04/21/13)  SGPT (ALT) 20  SGOT (AST) 19  Total Protein, Serum 7.3  Albumin, Serum 3.6  Routine Chem:  10-Jul-15 14:49   Glucose, Serum  134  BUN 17  Creatinine (comp) 1.25  Sodium, Serum 138  Potassium, Serum 4.1  Chloride, Serum 102  CO2, Serum 28  Calcium (Total), Serum 8.7  Osmolality (calc) 279  eGFR (African American) >60  eGFR (Non-African American)  53 (eGFR values <74mL/min/1.73 m2 may be an indication of chronic kidney disease (CKD). Calculated eGFR is useful in patients with stable renal function. The eGFR calculation will not be reliable in acutely ill patients when serum creatinine is changing rapidly. It is not useful in  patients on dialysis. The eGFR calculation may not be applicable to patients at the low and high extremes of body sizes, pregnant women, and vegetarians.)  Anion Gap 8  Routine UA:  10-Jul-15 17:26   Color (UA) Yellow  Clarity (UA) Hazy  Glucose (UA) Negative  Bilirubin (UA) Negative  Ketones (UA) Negative  Specific Gravity (UA) 1.016  Blood (UA) 3+  pH (UA) 7.0  Protein (UA) 100 mg/dL  Nitrite (UA) Negative  Leukocyte Esterase (UA) Negative  (Result(s) reported on 08 Dec 2013 at 05:51PM.)  RBC (UA) 1557 /HPF  WBC (UA) 2 /HPF  Bacteria (UA) TRACE  Epithelial Cells (UA) NONE SEEN  Result(s) reported on 08 Dec 2013 at 05:51PM.  Routine Hem:  10-Jul-15 14:49   WBC (CBC)  11.2  RBC (CBC) 4.50  Hemoglobin (CBC) 14.0  Hematocrit (CBC) 43.7  Platelet Count (CBC) 215 (Result(s) reported on 08 Dec 2013 at 03:07PM.)  MCV 97  MCH 31.2  MCHC 32.1  RDW 12.7   Radiology Results:  Radiology Results: XRay:    10-Jul-15 18:45, KUB - Kidney Ureter Bladder  KUB - Kidney Ureter Bladder  REASON FOR EXAM:    flank pain - L - eval for renal stone - hx of 6mm   stone on L noted on CT recentl  COMMENTS:       PROCEDURE: DXR - DXR KIDNEY URETER BLADDER  - Dec 08 2013  6:45PM     CLINICAL DATA:  Renal stones.    EXAM:  ABDOMEN - 1 VIEW    COMPARISON:  CT 11/12/2013.    FINDINGS:  Previously identified 7 mm stone in the left upper ureter is no  longer identified in this region on this plain film exam. Similar  calcification is noted in the left lower pelvis. This may represent  migration of the ureteral stone to the distal left ureter. Small and  large bowel are slightly dilated suggesting adynamic ileus.  Degenerative changes lumbar spine  and both hips. Interposition of  colon under the right hemidiaphragm is present.     IMPRESSION:  Previously identified left upper ureteral stone no longer identified  in the region of the left upper ureter. Similar calcific density  noted left lower pelvis. This may represent migration of the distal  left ureteral stone to the distal left ureter.      Electronically Signed    By: Marcello Moores  Register    On: 12/08/2013 19:01         Verified By: Osa Craver, M.D., MD  CT:    10-Jul-15 20:29, CT Abdomen Pelvis WO for Stone  CT Abdomen Pelvis WO for Stone  REASON FOR EXAM:    pain - L flank - hx of 7 mm stone - assess for   location and hydronephrosis  COMMENTS:       PROCEDURE:  CT  - CT ABDOMEN /PELVIS WO (STONE)  - Dec 08 2013  8:29PM     CLINICAL DATA:  Recently diagnosed with a 7 mm calculus in a few  weeks ago, having LEFT flank and groin pain radiating to testis,  difficulty passing urine    EXAM:  CT ABDOMEN AND PELVIS WITHOUT CONTRAST    TECHNIQUE:  Multidetector CT imaging of the abdomen and pelvis was performed  following the standard protocol without IV contrast. Sagittal and  coronal MPR images reconstructed from axial data set. Oral contrast  not utilized for this indication.    COMPARISON:  11/12/2013    FINDINGS:  Bibasilar pulmonary fibrosis with honeycomb formation.    Increased hydronephrosis and LEFT hydroureter since previous exam  terminating a 6 mm distal LEFT ureteral calculus image 88.    This stone has progressed distally from the proximal ureter since  the previous exam.  Mildly increased LEFT perinephric edema.    Tiny nonobstructing RIGHT renal calculus.    Hepatic cysts largest 8.4 x 8.7 cm image 32.    Remainder of liver, spleen, pancreas, kidneys, and adrenal glands  normal.    Normal appendix.    Bladder and RIGHT ureter unremarkable.    Distal colonic diverticulosis without evidence of diverticulitis.  Stomach and small bowel loops normal for technique.    Scattered atherosclerotic calcification aorta and coronary arteries.    No mass, adenopathy, ascites, or free air.    No hernia or acute osseous findings.    Tiny lipoma again identified within the RIGHT gluteus maximus, 2.0  cm diameter.     IMPRESSION:  6 mm distal LEFT ureteral calculus, progressed distally within the  LEFT ureter since 11/12/2013.  Increase in LEFT hydronephrosis and hydroureter since previous exam.    Hepatic cysts largest 8.7 x 8.4 cm.    Pulmonary fibrosis at lung bases.      Electronically Signed    By: Lavonia Dana M.D.    On: 12/08/2013 20:47         Verified By: Burnetta Sabin, M.D.,    Mysoline:  Unknown  Erythromycin: Unknown   General Aspect consulting team: Dr Lunette Stands   reason for consult: nephrolithiasis   Present Illness Kevin Rasmussen is a pleasant 81 y/o M With a history of nephrolithiasis who is seen in consultation at the request of Dr. Lunette Stands for evaluation and recommendations regarding this concern. Kevin Rasmussen presented to the ED today with acute onset hematuria, left sided abdominal and testicular pain, and nausea and dry heaving. This started this morning when he was trying to have a  bowel movement. He was diagnosed with a kidney stone nearly 1 month ago and has been waiting for urologic followup, which isn't scheduled until the 30th (thus more than 6 weeks after his initial ER visit). His pain improved in the ED but he is still having left sided abdominal and testicular pain. No flank pain. No fevers but some cold sweats. + nausea and dry heaving. He had a kidney stone 40 years ago that he passed on his own. No other urologic history. He does have a history of pulmonary fibrosis and uses home oxygen, esp when walking.   Case History and Physical Exam:  Chief Complaint Abdominal Pain   Past Surgical History Inguinal Hernia Repair   Family History h/o pulmonary fibrosis in his dad, sister, and uncle   HEENT PERLA   Neck/Nodes Supple  No Adenopathy   Chest/Lungs decreased breath sounds b/l   Breasts WNL   Cardiovascular Normal Sinus Rhythm   Abdomen Active bowel sounds  Quiet  mild LLQ tenderness, no CVAT   Genitalia WNL   Musculoskeletal normal range of motion in upper ext's   Neurological Grossly WNL   Skin Warm  Dry    Impression CT personally reviewed: shows distal 18mm left ureteral stone, mild hydronephrosis and perinephric stranding  81 y/o M with 1 month history of left ureterolithiasis with worsening hydronephrosis and abdominal pain   Plan - consented for cystoscopy, left RPG, left ureteral stent placement, possible ureteroscopic stone  extraction - discussed risks and benefits of procedure including but not limited to infection, bleeding, damage to ureter/bladder/urethra and other organs, stent side effects, stent migration, need for future surgery if stone can't be removed. I don't have access to a laser so stone would need to be small and distal to take out. Patient expressed understanding that I might not be able to remove the stone. Also discussed risks of anesthesia with his pulmonary fibrosis and he understands that he is at a higher risk of lung complications - for this reason I will also try to basket extract stone tomorrow if amenable to reduce his number of GA cases. He expressed understanding. will order cipro on call to OR. NPO after MN.   Electronic Signatures: Charna Archer (MD)  (Signed 11-Jul-15 00:16)  Authored: Health Issues, Significant Events - History, Medications, Home Medications, Labs, Radiology Results, Allergies, General Aspect/Present Illness, History and Physical Exam, Impression/Plan   Last Updated: 11-Jul-15 00:16 by Charna Archer (MD)

## 2014-09-23 NOTE — Op Note (Signed)
PATIENT NAME:  Kevin Rasmussen, Kevin Rasmussen MR#:  [id number] DATE OF BIRTH:  [birth year]  DATE OF PROCEDURE:  10/19/2011  PREOPERATIVE DIAGNOSIS:  Cataract, left eye.    POSTOPERATIVE DIAGNOSIS:  Cataract, left eye.  PROCEDURE PERFORMED:  Extracapsular cataract extraction using phacoemulsification with placement of an Alcon SN6CWS, 19.5-diopter posterior chamber lens, serial # [id number].  SURGEON:  Maylon PeppersSteven A. Kaitrin Seybold, MD  ASSISTANT:  None.  ANESTHESIA:  4% lidocaine and 0.75% Marcaine in a 50/50 mixture with 10 units/mL of Hylenex added, given as a peribulbar.  ANESTHESIOLOGIST:  Dr. Pernell DupreAdams   COMPLICATIONS:  None.  ESTIMATED BLOOD LOSS:  Less than 1 mL.  DESCRIPTION OF PROCEDURE:  The patient was brought to the operating room and given a peribulbar block.  The patient was then prepped and draped in the usual fashion.  The vertical rectus muscles were imbricated using 5-0 silk sutures.  These sutures were then clamped to the sterile drapes as bridle sutures.  A limbal peritomy was performed extending two clock hours and hemostasis was obtained with cautery.  A partial thickness scleral groove was made at the surgical limbus and dissected anteriorly in a lamellar dissection using an Alcon crescent knife.  The anterior chamber was entered supero-temporally with a Superblade and through the lamellar dissection with a 2.6 mm keratome.  DisCoVisc was used to replace the aqueous and a continuous tear capsulorrhexis was carried out.  Hydrodissection and hydrodelineation were carried out with balanced salt and a 27 gauge canula.  The nucleus was rotated to confirm the effectiveness of the hydrodissection.  Phacoemulsification was carried out using a divide-and-conquer technique.  Total ultrasound time was 2 minutes and 43 seconds with an average power of 18.8 percent, CDE 46.30.  Irrigation/aspiration was used to remove the residual cortex.  DisCoVisc was used to inflate the capsule and the internal  incision was enlarged to 3 mm with the crescent knife.  The intraocular lens was folded and inserted into the capsular bag using the AcrySert delivery system.  Irrigation/aspiration was used to remove the residual DisCoVisc.  Miostat was injected into the anterior chamber through the paracentesis track to inflate the anterior chamber and induce miosis.  The wound was checked for leaks and none were found. The conjunctiva was closed with cautery and the bridle sutures were removed.  Two drops of 0.3% Vigamox were placed on the eye.   An eye shield was placed on the eye.  The patient was discharged to the recovery room in good condition.  ____________________________ Maylon PeppersSteven A. Teja Costen, MD sad:drc D: 10/19/2011 12:47:59 ET T: 10/19/2011 12:54:29 ET JOB#: [id number]  cc: Viviann SpareSteven A. Vala Raffo, MD, <Dictator> Erline LevineSTEVEN A Konnie Noffsinger MD ELECTRONICALLY SIGNED 10/23/2011 12:02

## 2014-09-23 NOTE — Op Note (Signed)
PATIENT NAME:  Kevin Rasmussen, Kevin Rasmussen MR#:  [id number] DATE OF BIRTH:  [birth year]  DATE OF PROCEDURE:  11/09/2011  PREOPERATIVE DIAGNOSIS: Cataract, right eye.   POSTOPERATIVE DIAGNOSIS: Cataract, right eye.   PROCEDURE PERFORMED: Extracapsular cataract extraction using phacoemulsification with placement of an Alcon SN6CWS, 19-diopter posterior chamber lens, serial [id number].   SURGEON: Maylon PeppersSteven A. Christopher Glasscock, M.D.   ANESTHESIA: 4% lidocaine and 0.75% Marcaine in a 50-50 mixture with 10 units/mL of Hylenex added, given as a peribulbar.   ANESTHESIOLOGIST: Dr. Dimple Caseyice.   COMPLICATIONS: None.   ESTIMATED BLOOD LOSS: Less than 1 mL.   DESCRIPTION OF PROCEDURE:  The patient was brought to the operating room and given a peribulbar block.  The patient was then prepped and draped in the usual fashion.  The vertical rectus muscles were imbricated using 5-0 silk sutures.  These sutures were then clamped to the sterile drapes as bridle sutures.  A limbal peritomy was performed extending two clock hours and hemostasis was obtained with cautery.  A partial thickness scleral groove was made at the surgical limbus and dissected anteriorly in a lamellar dissection using an Alcon crescent knife.  The anterior chamber was entered superonasally with a Superblade and through the lamellar dissection with a 2.6 mm keratome.  DisCoVisc was used to replace the aqueous and a continuous tear capsulorrhexis was carried out.  Hydrodissection and hydrodelineation were carried out with balanced salt and a 27 gauge canula.  The nucleus was rotated to confirm the effectiveness of the hydrodissection.  Phacoemulsification was carried out using a divide-and-conquer technique.  Total ultrasound time was 2 minutes and 11 seconds with an average power of 21.2 percent.  CDE 41.75.  Irrigation/aspiration was used to remove the residual cortex.  DisCoVisc was used to inflate the capsule and the internal incision was enlarged to 3 mm  with the crescent knife.  The intraocular lens was folded and inserted into the capsular bag using the Acrysert delivery system.  Irrigation/aspiration was used to remove the residual DisCoVisc.  Miostat was injected into the anterior chamber through the paracentesis track to inflate the anterior chamber and induce miosis.  The wound was checked for leaks and none were found. The conjunctiva was closed with cautery and the bridle sutures were removed.  Two drops of 0.3% Vigamox were placed on the eye.   An eye shield was placed on the eye.  The patient was discharged to the recovery room in good condition.   ____________________________ Maylon PeppersSteven A. Lakishia Bourassa, MD sad:bjt D: 11/09/2011 13:18:05 ET T: 11/09/2011 13:55:28 ET JOB#: [id number]  cc: Viviann SpareSteven A. Maclovio Henson, MD, <Dictator> Erline LevineSTEVEN A Amiaya Mcneeley MD ELECTRONICALLY SIGNED 11/16/2011 13:00

## 2014-10-06 ENCOUNTER — Other Ambulatory Visit
Admission: EM | Admit: 2014-10-06 | Discharge: 2014-10-06 | Disposition: A | Discharge status: Home / Self Care | Payer: Medicare Other | Source: Ambulatory Visit | Attending: Ophthalmology | Admitting: Ophthalmology

## 2014-10-06 DIAGNOSIS — H3411 Central retinal artery occlusion, right eye: Secondary | ICD-10-CM | POA: Diagnosis present

## 2014-10-06 DIAGNOSIS — R51 Headache: Secondary | ICD-10-CM | POA: Insufficient documentation

## 2014-10-06 LAB — SEDIMENTATION RATE: SED RATE: 12 mm/h (ref 0–20)

## 2014-10-08 ENCOUNTER — Other Ambulatory Visit: Payer: Self-pay | Admitting: Ophthalmology

## 2014-10-08 DIAGNOSIS — H3411 Central retinal artery occlusion, right eye: Secondary | ICD-10-CM

## 2014-10-08 LAB — C-REACTIVE PROTEIN

## 2014-10-09 ENCOUNTER — Other Ambulatory Visit: Payer: Self-pay | Admitting: Neurology

## 2014-10-11 ENCOUNTER — Ambulatory Visit
Admission: RE | Admit: 2014-10-11 | Discharge: 2014-10-11 | Disposition: A | Discharge status: Home / Self Care | Payer: Medicare Other | Source: Ambulatory Visit | Attending: Ophthalmology | Admitting: Ophthalmology

## 2014-10-11 DIAGNOSIS — I6523 Occlusion and stenosis of bilateral carotid arteries: Secondary | ICD-10-CM | POA: Insufficient documentation

## 2014-10-11 DIAGNOSIS — H3411 Central retinal artery occlusion, right eye: Secondary | ICD-10-CM | POA: Diagnosis present

## 2014-10-11 DIAGNOSIS — H547 Unspecified visual loss: Secondary | ICD-10-CM | POA: Diagnosis not present

## 2015-05-02 DEATH — deceased
